# Patient Record
Sex: Male | Born: 1945 | Race: Black or African American | Hispanic: No | Marital: Single
Health system: Southern US, Community
[De-identification: ages and names within clinical notes are randomized; demographics above are authoritative.]

---

## 2011-11-23 ENCOUNTER — Inpatient Hospital Stay
Admission: RE | Admit: 2011-11-23 | Discharge: 2011-12-13 | Disposition: A | Discharge status: Hospice | Payer: Self-pay | Source: Ambulatory Visit | Attending: Internal Medicine | Admitting: Internal Medicine

## 2011-11-23 DIAGNOSIS — J869 Pyothorax without fistula: Secondary | ICD-10-CM

## 2011-11-23 DIAGNOSIS — J189 Pneumonia, unspecified organism: Secondary | ICD-10-CM

## 2011-11-23 DIAGNOSIS — F0390 Unspecified dementia without behavioral disturbance: Secondary | ICD-10-CM

## 2011-11-24 ENCOUNTER — Other Ambulatory Visit (HOSPITAL_COMMUNITY): Payer: Self-pay

## 2011-11-24 LAB — RENAL FUNCTION PANEL
CO2: 32 mEq/L (ref 19–32)
Calcium: 8 mg/dL — ABNORMAL LOW (ref 8.4–10.5)
Chloride: 103 mEq/L (ref 96–112)
Creatinine, Ser: 0.46 mg/dL — ABNORMAL LOW (ref 0.50–1.35)
GFR calc Af Amer: >90 mL/min (ref >90)
Glucose, Bld: 119 mg/dL — ABNORMAL HIGH (ref 70–99)

## 2011-11-24 LAB — CBC WITH DIFFERENTIAL/PLATELET
Eosinophils Relative: 3 % (ref 0–5)
HCT: 31.4 % — ABNORMAL LOW (ref 39.0–52.0)
Hemoglobin: 9.8 g/dL — ABNORMAL LOW (ref 13.0–17.0)
Lymphocytes Relative: 17 % (ref 12–46)
Lymphs Abs: 1.2 10*3/uL (ref 0.7–4.0)
MCV: 84.6 fL (ref 78.0–100.0)
Monocytes Absolute: 0.6 10*3/uL (ref 0.1–1.0)
RBC: 3.71 MIL/uL — ABNORMAL LOW (ref 4.22–5.81)
WBC: 7 10*3/uL (ref 4.0–10.5)

## 2011-11-24 LAB — HEPATIC FUNCTION PANEL
ALT: 20 U/L (ref 0–53)
AST: 26 U/L (ref 0–37)
Total Protein: 7.3 g/dL (ref 6.0–8.3)

## 2011-11-24 LAB — PROTIME-INR: INR: 1.12 (ref 0.00–1.49)

## 2011-11-24 LAB — PREALBUMIN: Prealbumin: 14.2 mg/dL — ABNORMAL LOW (ref 17.0–34.0)

## 2011-11-24 LAB — FERRITIN: Ferritin: 454 ng/mL — ABNORMAL HIGH (ref 22–322)

## 2011-11-24 LAB — IRON AND TIBC
Iron: 33 ug/dL — ABNORMAL LOW (ref 42–135)
UIBC: 107 ug/dL — ABNORMAL LOW (ref 125–400)

## 2011-11-24 LAB — MAGNESIUM: Magnesium: 1.9 mg/dL (ref 1.5–2.5)

## 2011-11-25 ENCOUNTER — Other Ambulatory Visit (HOSPITAL_COMMUNITY): Payer: Self-pay

## 2011-11-26 LAB — SEDIMENTATION RATE: Sed Rate: 56 mm/hr — ABNORMAL HIGH (ref 0–16)

## 2011-11-26 LAB — PROCALCITONIN: Procalcitonin: <0.1 ng/mL

## 2011-11-26 LAB — HIGH SENSITIVITY CRP: CRP, High Sensitivity: 43.8 mg/L — ABNORMAL HIGH

## 2011-11-27 ENCOUNTER — Other Ambulatory Visit (HOSPITAL_COMMUNITY): Payer: Self-pay

## 2011-11-28 ENCOUNTER — Other Ambulatory Visit (HOSPITAL_COMMUNITY): Payer: Self-pay

## 2011-11-28 LAB — CBC WITH DIFFERENTIAL/PLATELET
Lymphocytes Relative: 14 % (ref 12–46)
Lymphs Abs: 1 10*3/uL (ref 0.7–4.0)
MCV: 83.3 fL (ref 78.0–100.0)
Neutro Abs: 5.8 10*3/uL (ref 1.7–7.7)
Neutrophils Relative %: 77 % (ref 43–77)
Platelets: 255 10*3/uL (ref 150–400)
RBC: 4.08 MIL/uL — ABNORMAL LOW (ref 4.22–5.81)
WBC: 7.5 10*3/uL (ref 4.0–10.5)

## 2011-11-28 LAB — BASIC METABOLIC PANEL
CO2: 33 mEq/L — ABNORMAL HIGH (ref 19–32)
Chloride: 96 mEq/L (ref 96–112)
Glucose, Bld: 153 mg/dL — ABNORMAL HIGH (ref 70–99)
Potassium: 2.9 mEq/L — ABNORMAL LOW (ref 3.5–5.1)
Sodium: 138 mEq/L (ref 135–145)

## 2011-11-28 LAB — MAGNESIUM: Magnesium: 1.8 mg/dL (ref 1.5–2.5)

## 2011-11-29 LAB — MAGNESIUM: Magnesium: 1.8 mg/dL (ref 1.5–2.5)

## 2011-11-29 LAB — BASIC METABOLIC PANEL
CO2: 27 mEq/L (ref 19–32)
GFR calc non Af Amer: >90 mL/min (ref >90)
Glucose, Bld: 125 mg/dL — ABNORMAL HIGH (ref 70–99)
Potassium: 4.5 mEq/L (ref 3.5–5.1)
Sodium: 132 mEq/L — ABNORMAL LOW (ref 135–145)

## 2011-11-30 ENCOUNTER — Other Ambulatory Visit (HOSPITAL_COMMUNITY): Payer: Self-pay

## 2011-11-30 DIAGNOSIS — J189 Pneumonia, unspecified organism: Secondary | ICD-10-CM

## 2011-11-30 DIAGNOSIS — F0390 Unspecified dementia without behavioral disturbance: Secondary | ICD-10-CM

## 2011-11-30 DIAGNOSIS — J869 Pyothorax without fistula: Secondary | ICD-10-CM

## 2011-11-30 LAB — CBC WITH DIFFERENTIAL/PLATELET
Basophils Absolute: 0 10*3/uL (ref 0.0–0.1)
Eosinophils Relative: 3 % (ref 0–5)
HCT: 30.9 % — ABNORMAL LOW (ref 39.0–52.0)
Lymphocytes Relative: 23 % (ref 12–46)
Lymphs Abs: 1.5 10*3/uL (ref 0.7–4.0)
MCV: 83.3 fL (ref 78.0–100.0)
Neutro Abs: 4.1 10*3/uL (ref 1.7–7.7)
Platelets: 230 10*3/uL (ref 150–400)
RBC: 3.71 MIL/uL — ABNORMAL LOW (ref 4.22–5.81)
WBC: 6.3 10*3/uL (ref 4.0–10.5)

## 2011-11-30 LAB — BASIC METABOLIC PANEL
CO2: 30 mEq/L (ref 19–32)
Calcium: 9.1 mg/dL (ref 8.4–10.5)
Chloride: 97 mEq/L (ref 96–112)
Glucose, Bld: 113 mg/dL — ABNORMAL HIGH (ref 70–99)
Sodium: 135 mEq/L (ref 135–145)

## 2011-11-30 NOTE — Procedures (Signed)
Successful placement of DL PICC to (R)brachial vein. Length 41cm, tip at SVC/RA Ready for use.  Brayton El PA-C 11/30/2011 11:17 AM

## 2011-11-30 NOTE — Consult Note (Signed)
Name: Jason Ramirez MRN: [id number] DOB: December 01, 1945    LOS: 7  Referring Provider:  [facility] Reason for Referral:  Chest tube management  PULMONARY / CRITICAL CARE MEDICINE  HPI: 66 yo demented male who was admitted to [facility] hospital 11/12/11 with sepsis from bacterial pna with chest tube(pig tail) placement in presumed empyema. Cultures were + for klebsiella and proteus both ss to cipro. Unfortunately he developed seizures on cipro and requires anticonvulsants. PCCM consulted 8/2 for left pig tail chest tube management.  Note he was an DNI/DNR prior to transfer.  No past medical history on file. No past surgical history on file. Prior to Admission medications   Not on File   Allergies cipro  Family History + for dementia, cva, cancer. Social History Demented and lived with his sister. 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 Non verbal  Events Since Admission:   Current Status: Non communative Vital Signs:  Vital signs reviewed. Abnormal values will appear under impression plan section.  Dg Chest Portable 1 View  11/30/2011  *RADIOLOGY REPORT*  Clinical Data: Pneumonia, follow-up  PORTABLE CHEST - 1 VIEW  Comparison: Portable chest x-ray of 11/28/2011  Findings: Opacity at the medial left lung base appears stable and a pigtail drainage catheter remains at the left lung base. There is some airspace disease within the left mid and upper lung field which appears stable. The left central venous line has been removed.  The right lung is clear.  Heart size is stable.  An NG tube is noted.  IMPRESSION:  1.  No change in left basilar opacity with pigtail catheter present. 2.  Left central venous line has been removed.  Original Report Authenticated By: Juline Patch, M.D.   Dg Chest Port 1 View  11/28/2011  *RADIOLOGY REPORT*  Clinical Data: PICC line placement.  PORTABLE CHEST - 1 VIEW  Comparison: 11/24/2011  Findings: Left PICC line is in place.   The tip courses somewhat transversely to the right and could be within the proximal SVC or the central azygos vein. Pigtail catheter again noted in the left lower hemithorax.  Patchy airspace disease within the left lung is again noted, improved in the left upper lobe.  Right lung is clear. Heart is normal size.  NG tube remains in the stomach.  IMPRESSION: Left PICC line tip projects transversely over the mediastinum and may be within the upper SVC or central azygos vein.  Patchy left lung airspace disease, improved in the left upper lobe.  Left pigtail catheter remains in place.  No pneumothorax.  Original Report Authenticated By: Cyndie Chime, M.D.    Lab 11/30/11 0509 11/29/11 0515 11/28/11 0642  NA 135 132* 138  K 4.0 4.5 2.9*  CL 97 96 96  CO2 30 27 33*  BUN 10 10 8   CREATININE 0.47* 0.48* 0.49*  GLUCOSE 113* 125* 153*    Lab 11/30/11 0509 11/28/11 0642 11/24/11 0443  HGB 9.9* 10.8* 9.8*  HCT 30.9* 34.0* 31.4*  WBC 6.3 7.5 7.0  PLT 230 255 216      Physical Examination: General:  Wasted and non verbal Neuro: demented, no follows commands. HEENT:  No adneopathy Neck:  No jvd Cardiovascular:  hsr rrr Lungs:  Decreased on lt base. Left pigtail with chocolate drainage Abdomen:  + bs NGT with TF Musculoskeletal:  Lt foot amputation noted Skin: warm   ASSESSMENT AND PLAN  PULMONARY No results found for this basename: PHART:5,PCO2:5,PCO2ART:5,PO2ART:5,HCO3:5,O2SAT:5 in  the last 168 hours Ventilator Settings:      A:  Left empyema with pigtail cath in place, horrible quality pre empyema, nonverbal P:   -continue chest tube till drainage pure pus, purulent -doubt he is candidate for surgical procedure ever -could consider larger bore chest tube 28 french and CT chest folow up if family wishes aggressive care; I doubt any further therpy will improve his quality of life  -CONSIDER further discussions with family to dc pigtail and full comfort -continue abx (ceftazidime)   Per IM -follow chest x rays Can reculture empyema output and ensure no new abx required, resistance now   Dementia -per IM   Poor prognosis -per IM  Brett Canales Minor ACNP [facility] PCCM Pager [phone] till 3 pm If no answer page [phone] 11/30/2011, 2:13 PM   I have fully examined this patient and agree with above findings.    And edited infull  Mcarthur Rossetti. Tyson Alias, MD, FACP Pgr: [phone] [facility]

## 2011-12-02 LAB — BASIC METABOLIC PANEL
BUN: 10 mg/dL (ref 6–23)
CO2: 31 mEq/L (ref 19–32)
Chloride: 97 mEq/L (ref 96–112)
Creatinine, Ser: 0.43 mg/dL — ABNORMAL LOW (ref 0.50–1.35)
GFR calc Af Amer: >90 mL/min (ref >90)
Glucose, Bld: 118 mg/dL — ABNORMAL HIGH (ref 70–99)
Potassium: 3.5 mEq/L (ref 3.5–5.1)

## 2011-12-02 LAB — CBC
HCT: 28.7 % — ABNORMAL LOW (ref 39.0–52.0)
Hemoglobin: 9.2 g/dL — ABNORMAL LOW (ref 13.0–17.0)
MCV: 83.2 fL (ref 78.0–100.0)
RBC: 3.45 MIL/uL — ABNORMAL LOW (ref 4.22–5.81)
WBC: 5 10*3/uL (ref 4.0–10.5)

## 2011-12-03 ENCOUNTER — Other Ambulatory Visit (HOSPITAL_COMMUNITY): Payer: Self-pay

## 2011-12-03 NOTE — Procedures (Signed)
Successful exchange of 44 cm dual lumen PICC line with tip at the superior caval-atrial junction.  The PICC line is ready for immediate use.

## 2011-12-03 NOTE — Consult Note (Signed)
Name: Jason Ramirez MRN: [id number] DOB: August 15, 1945    LOS: 10  Referring Provider:  [facility] Reason for Referral:  Chest tube management  PULMONARY / CRITICAL CARE MEDICINE  HPI: 66 yo demented male who was admitted to [facility] hospital 11/12/11 with sepsis from bacterial pna with chest tube(pig tail) placement in presumed empyema. Cultures were + for klebsiella and proteus both ss to cipro. Unfortunately he developed seizures on cipro and requires anticonvulsants. PCCM consulted 8/2 for left pig tail chest tube management.  Note he was an DNI/DNR prior to transfer.  Current Status: Non communative  Vital Signs:  Vital signs reviewed. Abnormal values will appear under impression plan section.  Dg Chest Port 1 View  12/03/2011  *RADIOLOGY REPORT*  Clinical Data: Chest tube  PORTABLE CHEST - 1 VIEW  Comparison: 11/30/2011  Findings: 0629 hours.  The patient is rotated to the left.  Pigtail catheter coiled over the left hemithorax is again noted. The right PICC line tip has migrated in the interval since placement and the tip is now folded upon itself in the left innominate vein. NG tube tip is in the proximal stomach.  Right lung is clear. Cardiopericardial silhouette is stable.  Left base new atelectasis again noted.  IMPRESSION:  No evidence for pneumothorax in the left chest.  Right PICC line tip has migrated in the interval is now looped upon itself with the tip in the left innominate vein.  Original Report Authenticated By: ERIC A. MANSELL, M.D.   Lab 12/02/11 0500 11/30/11 0509 11/29/11 0515  NA 136 135 132*  K 3.5 4.0 4.5  CL 97 97 96  CO2 31 30 27   BUN 10 10 10   CREATININE 0.43* 0.47* 0.48*  GLUCOSE 118* 113* 125*   Lab 12/02/11 0500 11/30/11 0509 11/28/11 0642  HGB 9.2* 9.9* 10.8*  HCT 28.7* 30.9* 34.0*  WBC 5.0 6.3 7.5  PLT 193 230 255   Physical Examination: General:  Wasted and non verbal Neuro: demented, no follows commands. HEENT:  No adneopathy Neck:  No  jvd Cardiovascular:  hsr rrr Lungs:  Decreased on lt base. Left pigtail with chocolate drainage Abdomen:  + bs NGT with TF Musculoskeletal:  Lt foot amputation noted Skin: warm  ASSESSMENT AND PLAN  PULMONARY No results found for this basename: PHART:5,PCO2:5,PCO2ART:5,PO2ART:5,HCO3:5,O2SAT:5 in the last 168 hours Ventilator Settings:  A:  Left empyema with pigtail cath in place, horrible quality pre empyema, nonverbal P:   -Continue chest tube till drainage pure pus, purulent. -Doubt he is candidate for surgical procedure ever given alzheimer. -Could consider larger bore chest tube 28 french and CT chest folow up if family wishes aggressive care; I doubt any further therpy will improve his quality of life.  Will have primary speak with family, if they wish will change chest tube. -Recommend change to full comfort care. -Continue abx (ceftazidime)  Per IM -Follow chest x rays if chest tube is replaced. -Can reculture empyema output and ensure no new abx required for now.   Dementia -per IM   Poor prognosis -per IM  Alyson Reedy, M.D. [facility]. Pager: [phone]. After hours pager: [phone].

## 2011-12-05 NOTE — Progress Notes (Signed)
Name: Jason Ramirez MRN: [id number] DOB: 1946-04-09    LOS: 12  Referring Provider:  [facility] Reason for Referral:  Chest tube management  PULMONARY / CRITICAL CARE MEDICINE  HPI: 66 yo demented male who was admitted to [facility] hospital 11/12/11 with sepsis from bacterial pna with chest tube(pig tail) placement in presumed empyema. Cultures were + for klebsiella and proteus both ss to cipro. Unfortunately he developed seizures on cipro and requires anticonvulsants. PCCM consulted 8/2 for left pig tail chest tube management.  Note he was an DNI/DNR prior to transfer.  Current Status: Non communicative.  Per [facility] NP family has decided to pursue hospice with no further aggressive care.   Vital Signs:  Vital signs reviewed. Abnormal values will appear under impression plan section.  No results found.  Lab 12/02/11 0500 11/30/11 0509 11/29/11 0515  NA 136 135 132*  K 3.5 4.0 4.5  CL 97 97 96  CO2 31 30 27   BUN 10 10 10   CREATININE 0.43* 0.47* 0.48*  GLUCOSE 118* 113* 125*    Lab 12/02/11 0500 11/30/11 0509  HGB 9.2* 9.9*  HCT 28.7* 30.9*  WBC 5.0 6.3  PLT 193 230   Physical Examination: General:  Wasted and non verbal Neuro: demented, no follows commands. HEENT:  No adneopathy Neck:  No jvd Cardiovascular:  hsr rrr Lungs:  Decreased on lt base. Left pigtail with chocolate drainage Abdomen:  + bs NGT with TF Musculoskeletal:  Lt foot amputation noted Skin: warm  ASSESSMENT AND PLAN  PULMONARY No results found for this basename: PHART:5,PCO2:5,PCO2ART:5,PO2ART:5,HCO3:5,O2SAT:5 in the last 168 hours Ventilator Settings:  A:  Left empyema with pigtail cath in place, horrible quality pre empyema, nonverbal P:   -Agree with DNR, hospice per [facility] MD -Can cont pigtail as it is now for comfort or consider d/c if it is bothersome, likely not making big difference at this point  -Continue abx (ceftazidime)  Per IM   PCCM signing off please call back if needed.    Danford Bad, NP 12/05/2011  12:37 PM Pager: [phone] or [phone]  *Care during the described time interval was provided by me and/or other providers on the critical care team. I have reviewed this patient's available data, including medical history, events of note, physical examination and test results as part of my evaluation.  Patient continues to drain from pig tail catheter, family leaning more towards hospice, will not change pig tail to a chest tube.  PCCM will sign off, please call back if needed or if family change their minds regarding chest tube/hospice please call.  Patient seen and examined, agree with above note.  I dictated the care and orders written for this patient under my direction.  Koren Bound, M.D. [phone]

## 2011-12-06 LAB — CBC WITH DIFFERENTIAL/PLATELET
Basophils Relative: 1 % (ref 0–1)
Eosinophils Absolute: 0.2 10*3/uL (ref 0.0–0.7)
Eosinophils Relative: 4 % (ref 0–5)
HCT: 32.8 % — ABNORMAL LOW (ref 39.0–52.0)
Hemoglobin: 10.7 g/dL — ABNORMAL LOW (ref 13.0–17.0)
Lymphs Abs: 1.3 10*3/uL (ref 0.7–4.0)
MCH: 27.2 pg (ref 26.0–34.0)
MCHC: 32.6 g/dL (ref 30.0–36.0)
MCV: 83.5 fL (ref 78.0–100.0)
Monocytes Absolute: 0.5 10*3/uL (ref 0.1–1.0)
Monocytes Relative: 11 % (ref 3–12)
Neutro Abs: 2.8 10*3/uL (ref 1.7–7.7)
Neutrophils Relative %: 57 % (ref 43–77)
RBC: 3.93 MIL/uL — ABNORMAL LOW (ref 4.22–5.81)

## 2011-12-06 LAB — BASIC METABOLIC PANEL
BUN: 13 mg/dL (ref 6–23)
Chloride: 93 mEq/L — ABNORMAL LOW (ref 96–112)
Creatinine, Ser: 0.46 mg/dL — ABNORMAL LOW (ref 0.50–1.35)
Glucose, Bld: 105 mg/dL — ABNORMAL HIGH (ref 70–99)
Potassium: 4 mEq/L (ref 3.5–5.1)

## 2011-12-07 ENCOUNTER — Other Ambulatory Visit (HOSPITAL_COMMUNITY): Payer: Self-pay

## 2011-12-07 LAB — BASIC METABOLIC PANEL
BUN: 17 mg/dL (ref 6–23)
CO2: 32 mEq/L (ref 19–32)
Calcium: 9.6 mg/dL (ref 8.4–10.5)
Chloride: 96 mEq/L (ref 96–112)
Creatinine, Ser: 0.49 mg/dL — ABNORMAL LOW (ref 0.50–1.35)

## 2011-12-10 LAB — BASIC METABOLIC PANEL
BUN: 16 mg/dL (ref 6–23)
CO2: 32 mEq/L (ref 19–32)
Calcium: 9.3 mg/dL (ref 8.4–10.5)
Creatinine, Ser: 0.49 mg/dL — ABNORMAL LOW (ref 0.50–1.35)
GFR calc non Af Amer: >90 mL/min (ref >90)
Glucose, Bld: 127 mg/dL — ABNORMAL HIGH (ref 70–99)

## 2011-12-10 LAB — CBC WITH DIFFERENTIAL/PLATELET
Basophils Absolute: 0 10*3/uL (ref 0.0–0.1)
Basophils Relative: 1 % (ref 0–1)
Eosinophils Absolute: 0.2 10*3/uL (ref 0.0–0.7)
Eosinophils Relative: 3 % (ref 0–5)
Lymphs Abs: 1.4 10*3/uL (ref 0.7–4.0)
MCH: 27.2 pg (ref 26.0–34.0)
MCHC: 32.4 g/dL (ref 30.0–36.0)
MCV: 84 fL (ref 78.0–100.0)
Neutrophils Relative %: 63 % (ref 43–77)
Platelets: 284 10*3/uL (ref 150–400)
RDW: 18.7 % — ABNORMAL HIGH (ref 11.5–15.5)

## 2011-12-11 LAB — BASIC METABOLIC PANEL
Calcium: 9.2 mg/dL (ref 8.4–10.5)
GFR calc Af Amer: >90 mL/min (ref >90)
GFR calc non Af Amer: >90 mL/min (ref >90)
Glucose, Bld: 117 mg/dL — ABNORMAL HIGH (ref 70–99)
Sodium: 133 mEq/L — ABNORMAL LOW (ref 135–145)

## 2011-12-30 DEATH — deceased

## 2013-01-26 IMAGING — CR DG CHEST 1V PORT
1 series · 1 of 1 positions shown · non-contrast
Comparison: 11/30/2011

CLINICAL DATA: Chest tube

PORTABLE CHEST - 1 VIEW

[AP]
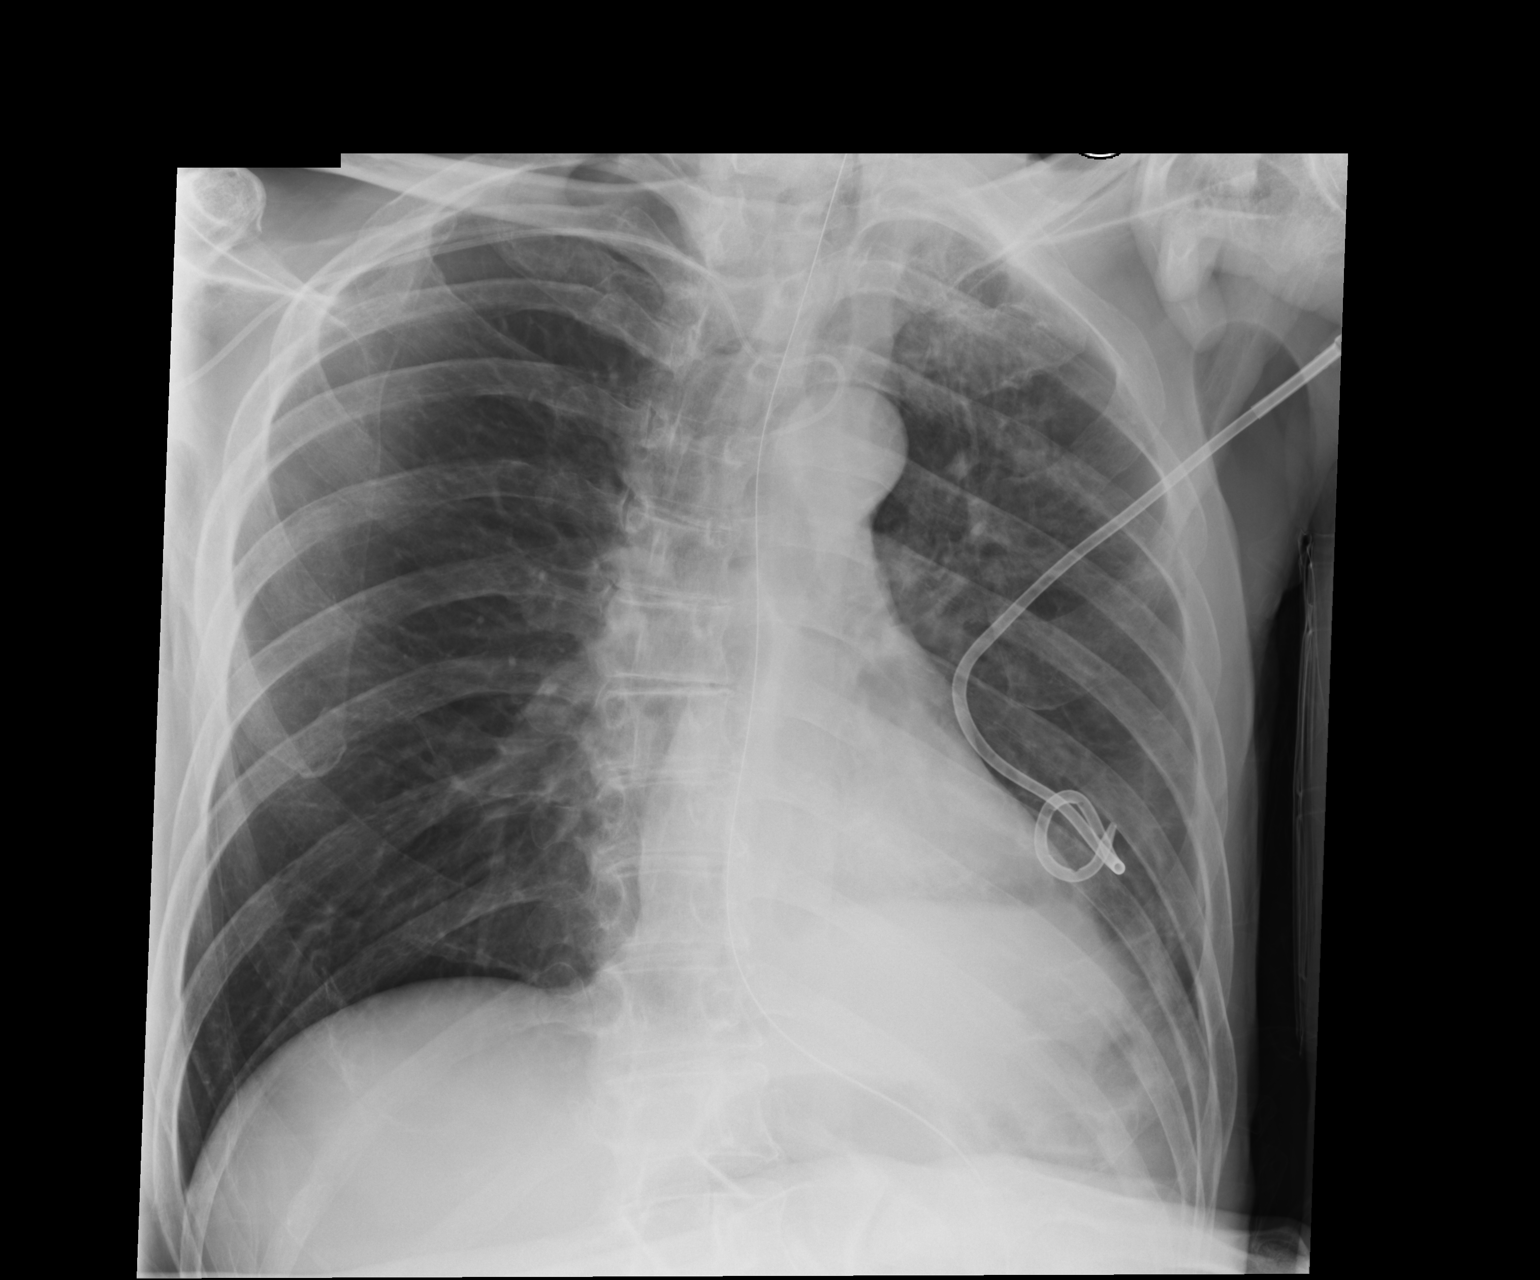

[1 of 1 positions shown; findings below may reference images not displayed]

FINDINGS: 9770 hours.  The patient is rotated to the left.  Pigtail
catheter coiled over the left hemithorax is again noted. The right
PICC line tip has migrated in the interval since placement and the
tip is now folded upon itself in the left innominate vein. NG tube
tip is in the proximal stomach.  Right lung is clear.
Cardiopericardial silhouette is stable.  Left base new atelectasis
again noted.
IMPRESSION: No evidence for pneumothorax in the left chest.

Right PICC line tip has migrated in the interval is now looped upon
itself with the tip in the left innominate vein.

## 2013-01-30 IMAGING — CR DG ABD PORTABLE 1V
1 series · 1 of 1 positions shown · non-contrast
Comparison: 11/27/2011.

CLINICAL DATA: Abdominal distention.

PORTABLE ABDOMEN - 1 VIEW

[AP]
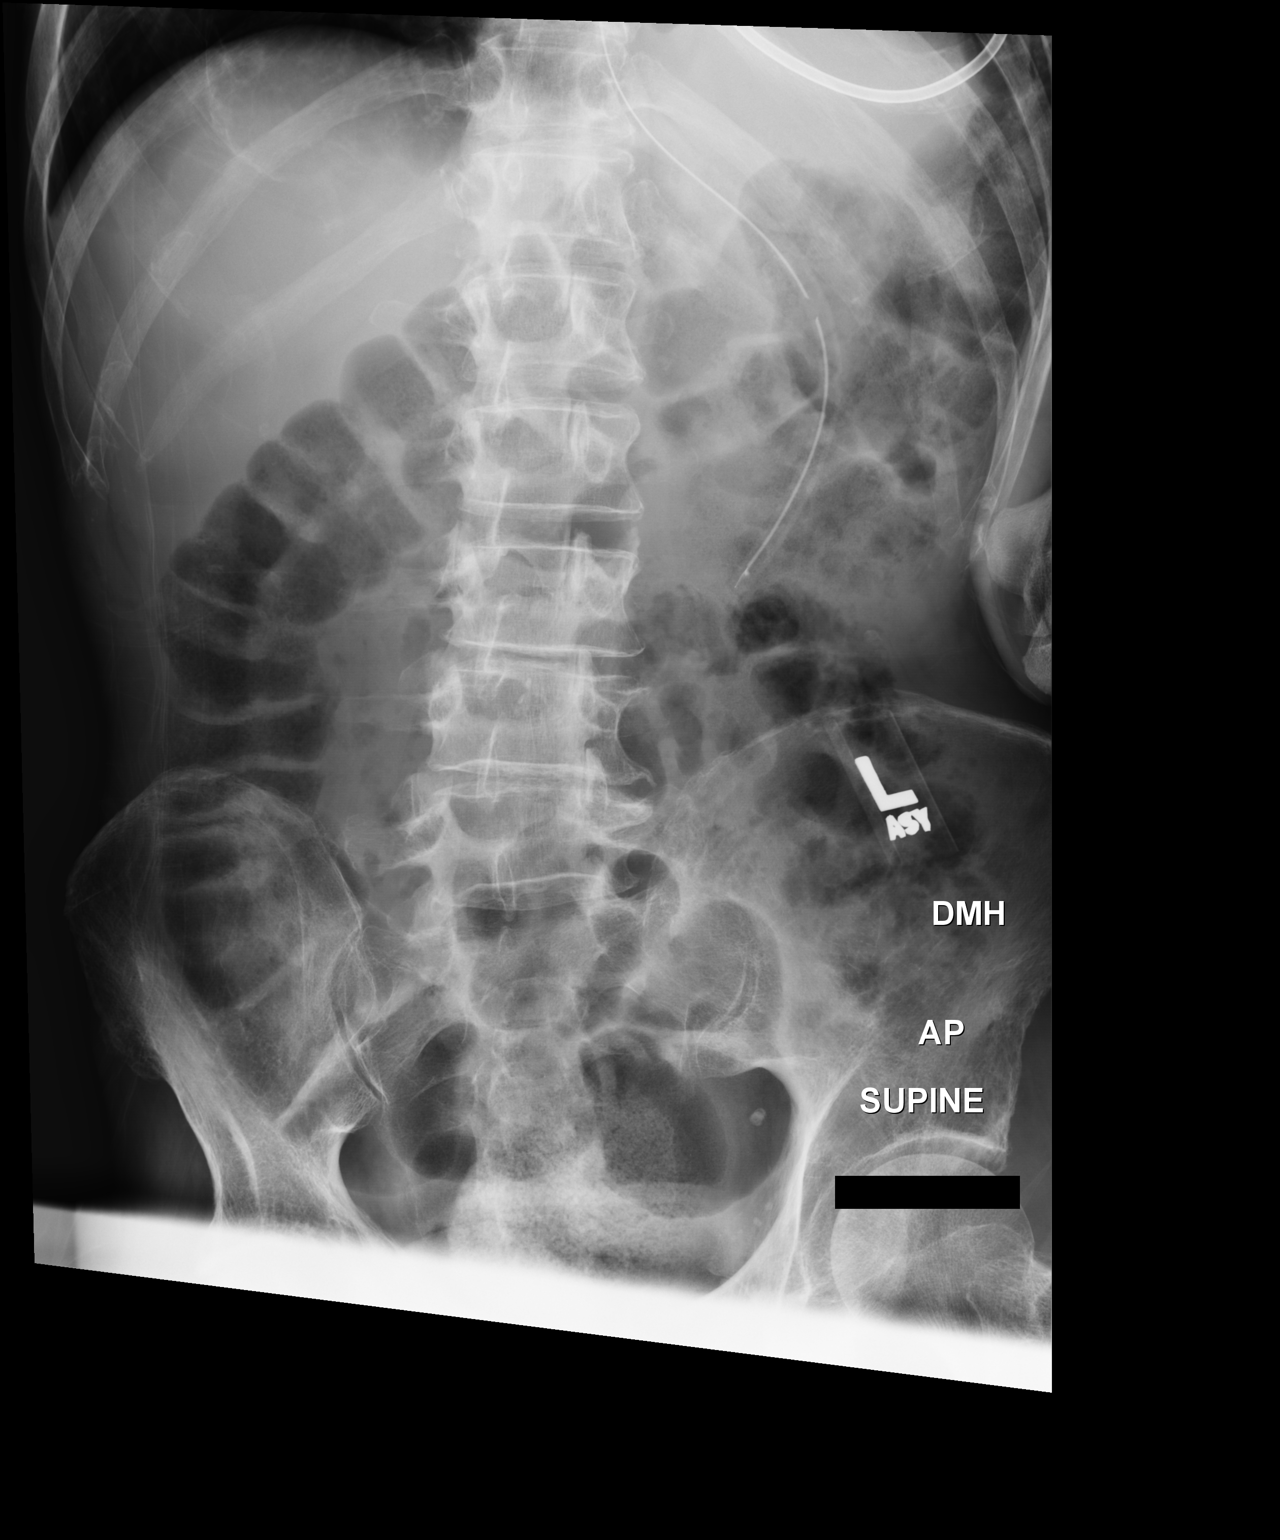

[1 of 1 positions shown; findings below may reference images not displayed]

FINDINGS: Nasogastric tube remains present in the stomach.  The tip
is in the fundus.  Large stool burden is present.  No gross plain
film evidence of free air.  Small bowel appears within normal
limits.
IMPRESSION: 1.  Unchanged nasogastric tube.
2.  Large stool burden.

## 2017-09-11 ENCOUNTER — Telehealth: Payer: Self-pay | Admitting: *Deleted

## 2017-09-11 NOTE — Telephone Encounter (Signed)
Entered in error
